# Patient Record
Sex: Female | Born: 1960 | Marital: Single
Health system: Northeastern US, Academic
[De-identification: ages and names within clinical notes are randomized; demographics above are authoritative.]

---

## 2015-11-21 ENCOUNTER — Ambulatory Visit: Admitting: Family

## 2015-11-23 LAB — HX HPV HIGH RISK BY TMA
CASE NUMBER: 2017172002883
HX HPV HIGH RISK BY TMA: NOT DETECTED

## 2015-11-27 LAB — HX GYN FINAL REPORT
CASE NUMBER: 0
HX FINAL CYTOLOGIC INTERPRETATION: NEGATIVE

## 2017-03-21 ENCOUNTER — Ambulatory Visit: Admitting: Orthopaedic Surgery

## 2017-06-30 ENCOUNTER — Ambulatory Visit: Admitting: Orthopaedic Surgery

## 2017-06-30 LAB — HX BASIC METABOLIC PANEL
CASE NUMBER: 2019029002162
HX ANION GAP: 9 — NL (ref 3.0–11.0)
HX BUN: 13 mg/dL — NL (ref 6.0–20.0)
HX CALCIUM LVL: 9.1 mg/dL — NL (ref 8.5–10.5)
HX CHLORIDE: 105 mmol/L — NL (ref 98.0–110.0)
HX CO2: 29 mmol/L — NL (ref 21.0–32.0)
HX CREATININE: 0.855 mg/dL — NL (ref 0.55–1.3)
HX GLUCOSE LVL: 112 mg/dL — ABNORMAL HIGH (ref 70.0–110.0)
HX POTASSIUM LVL: 3.4 mmol/L — ABNORMAL LOW (ref 3.6–5.2)
HX SODIUM LVL: 143 mmol/L — NL (ref 136.0–146.0)

## 2017-06-30 LAB — HX GLOMERULAR FILTRATION RATE (ESTIMATED)
CASE NUMBER: 2019029002162
HX AFN AMER GLOMERULAR FILTRATION RATE: 87 mL/min/{1.73_m2}
HX NON-AFN AMER GLOMERULAR FILTRATION RATE: 76 mL/min/{1.73_m2}

## 2017-06-30 LAB — HX CBC W/ INDICES
CASE NUMBER: 2019029002162
HX ABSOLUTE NRBC COUNT: 0 10*3/uL
HX HCT: 35.7 % — ABNORMAL LOW (ref 36.0–47.0)
HX HGB: 11.5 g/dL — ABNORMAL LOW (ref 11.8–16.0)
HX MCH: 28.7 pg — NL (ref 26.0–34.0)
HX MCHC: 32.2 g/dL — NL (ref 31.0–37.0)
HX MCV: 89 fL — NL (ref 80.0–100.0)
HX MPV: 10.1 fL — NL (ref 9.4–12.4)
HX NRBC PERCENT: 0 % — NL
HX PLATELET: 340 10*3/uL — NL (ref 150.0–400.0)
HX RBC: 4.01 10*6/uL — NL (ref 3.9–5.2)
HX RDW-CV: 13.2 % — NL (ref 11.5–14.5)
HX RDW-SD: 43.5 fL — NL (ref 35.0–51.0)
HX WBC: 8.8 10*3/uL — NL (ref 3.7–11.2)

## 2017-06-30 LAB — HX PSC EKG LAB: CASE NUMBER: 2019029002162

## 2017-07-15 ENCOUNTER — Ambulatory Visit: Admitting: Orthopaedic Surgery

## 2017-07-15 NOTE — Op Note (Signed)
__________________________________________________________________________    OPERATIVE REPORT  DATE:  07/15/2017    PREOPERATIVE DIAGNOSIS:  Right knee medial meniscal tear.     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    PROCEDURES:  Right knee arthroscopy with partial medial meniscectomy,  synovectomy and a chondroplasty three compartments.    SURGEON:  Colman Cater, D.O.    ASSISTANT:  None.     ANESTHESIA:  General.    ESTIMATED BLOOD LOSS:  Minimal.    TOURNIQUET TIME:  23 minutes.    SPECIMENS:  Shavings.    IMPLANTS:  None.    DISPOSITION:  The patient tolerated procedure well and was taken to Recovery  awake and stable.    INDICATIONS:  A 57 year old female with persistent anteromedial right knee pain  associated with swelling and giving way.  She has failed conservative efforts  including injections, therapy and activity modification.  X-rays are  unremarkable.  MRI shows a prior meniscectomy; however, no definitive tear.   Unfortunately, with continued pain and feelings of instability despite all  efforts, she elects for knee arthroscopy to evaluate for any new meniscal tears,  as a cause of her continued mechanical symptoms.  She consents.  She has been  medically cleared.    DESCRIPTION OF PROCEDURE:  The patient was taken awake and stable to the  Operative Suite, placed under general anesthetic and received 2 grams of Ancef  intravenously.  Exam revealed no instability.  Tourniquet was placed on the  right leg and bolsters on the bed as well.  Right leg was then sterilely prepped  and draped in the usual fashion.  The leg was exsanguinated with an Esmarch  bandage.  The tourniquet was inflated to 300 mmHg.    A time out was then performed to confirm the operative right knee.    An inferolateral portal was created.  Arthroscopy began with visualization of  the patella seating centered in the trochlea.  There was  reactive synovitis.   Some chondral debris floating throughout the joint.  Nothing significant.  There  were grade 2 changes to the patella and trochlea.  Medial compartment was  entered.  There was evidence of prior meniscectomy and some new tearing  anteriorly and a questionable tear in the posterior horn as well.    The inferomedial portal was started under direct visualization.  There was  instability through the posterior horn to probing.  It was addressed with the  biters from the posterior aspect of the middle third and then co-planed with a  shaver as well.  This left a stable rim.  Further shaving for partial anterior  horn meniscectomy anteriorly as well.  The meniscus was now probed showing good  stability from anterior to posterior.  Abrasion chondroplasty was performed.   Grade 2 changes diffusely.  All debris was removed.  The synovectomy continues  anterior medial compartments of extension across the notch and extending  anterior to the lateral compartment.  The ACL was stable.  Some mild scar tissue  adjacent to the ACL extending to the lateral compartment which was released with  a shaver.  The meniscus was stable.  There was no identifiable tear.  No fissure  or defect.  Some mild fraying of the articular cartilage of the plateau  addressed with a shaver.  All debris was removed from the medial compartment and  gutter.  Synovectomy of the suprapatellar pouch.  We  finished with chondroplasty  of the patella and trochlea.  The patella does track centrally.  After  irrigation, the knee was infiltrated with 10 mg of Duramorph and 20 mL of 0.5%  Marcaine with epinephrine.  Portals were closed with inverted Monocryl suture  and enhanced with Steri-Strips.  A dry sterile dressing was applied.  She was  extubated and taken to Recovery awake and stable.  She tolerated this procedure  well.    She will be discharged when she meets criteria.  Weightbearing as tolerated with  crutches.  Exercise beginning  today.  She was provided a script of Norco 5/325,  #28 and Ibuprofen 600 mg.  She will call for an appointment in two weeks or  sooner if there is any concern.    Dictated by:  Colman Cater, D.O.    D:  07/15/2017 10:45:25  T:  07/15/2017 11:54:10  E:  07/15/2017 11:54:10  KM/tam  Job# [id number]   SIGNATURE LINE    Electronically signed by Claire Shown on 07/15/2017 at 13:40:52 EST

## 2017-07-21 LAB — HX SURG PATH FINAL REPORT
CASE NUMBER: 0
HX NOTE: 88304

## 2022-02-09 IMAGING — OT DXA BONE DENSITY
2 series · 2 of 2 positions shown · non-contrast
Comparison: none

REASON FOR EXAM: Postmenopausal osteoporosis screening.

[Series 1: — · 1 of 1 slices shown (1 of 2)]
[im 1/1]
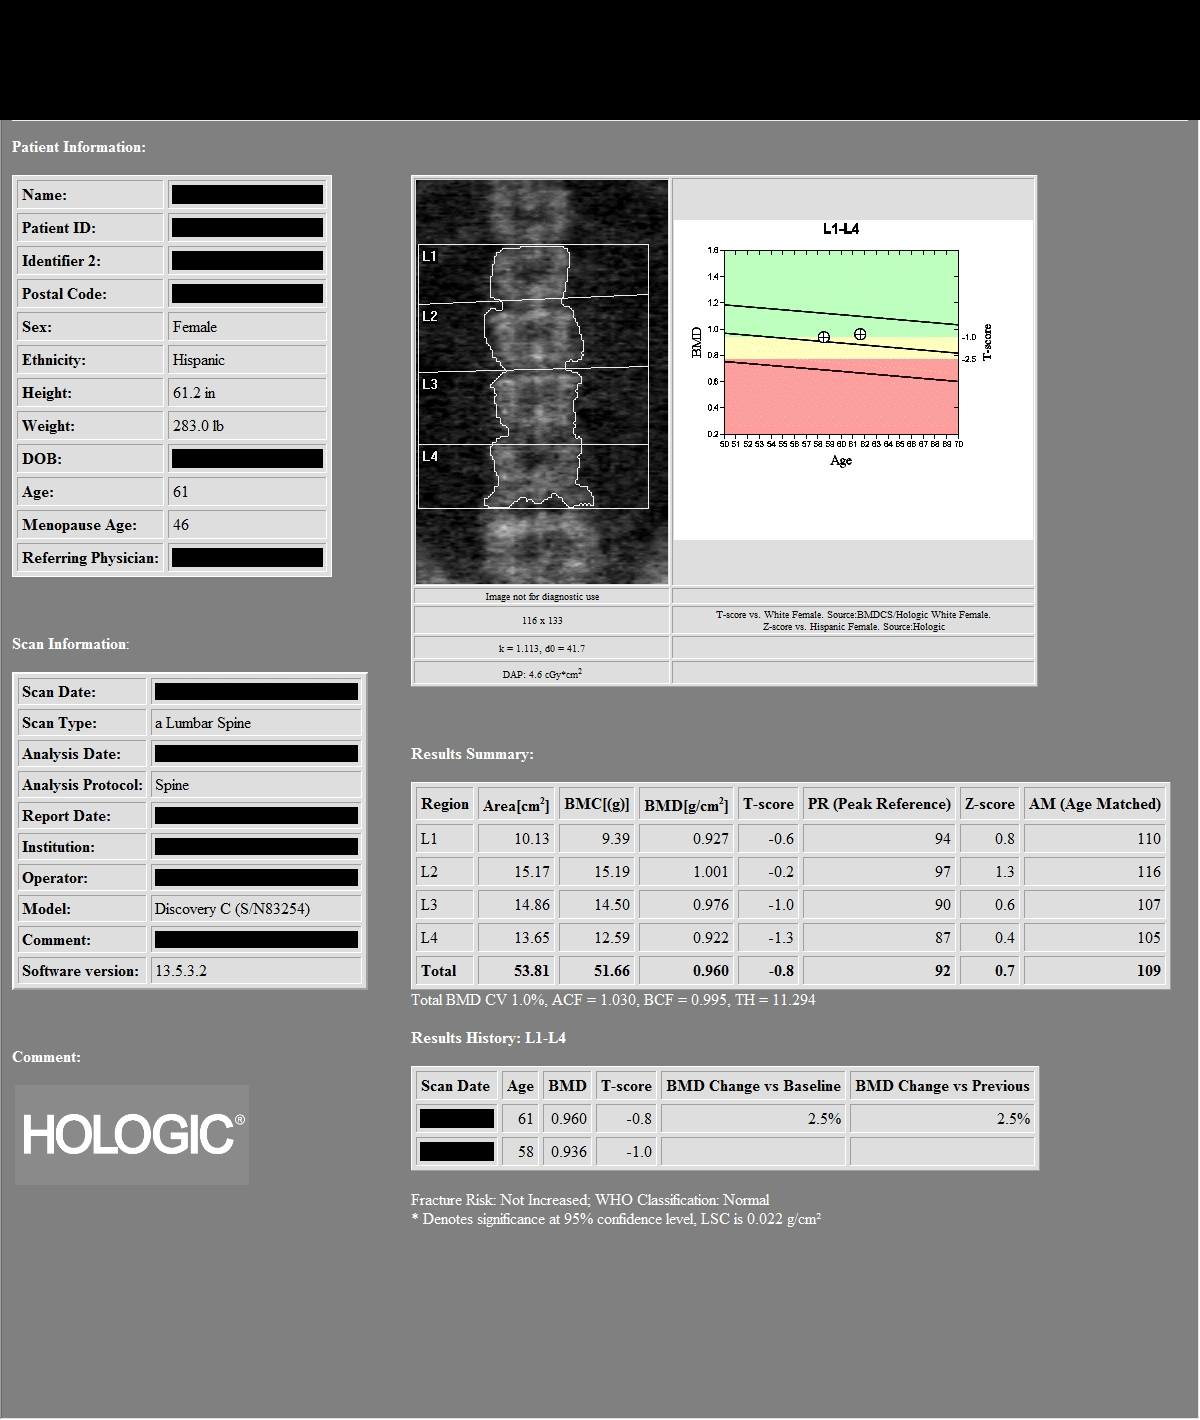

[Series 2: — · left · 1 of 1 slices shown (2 of 2)]
[im 1/1]
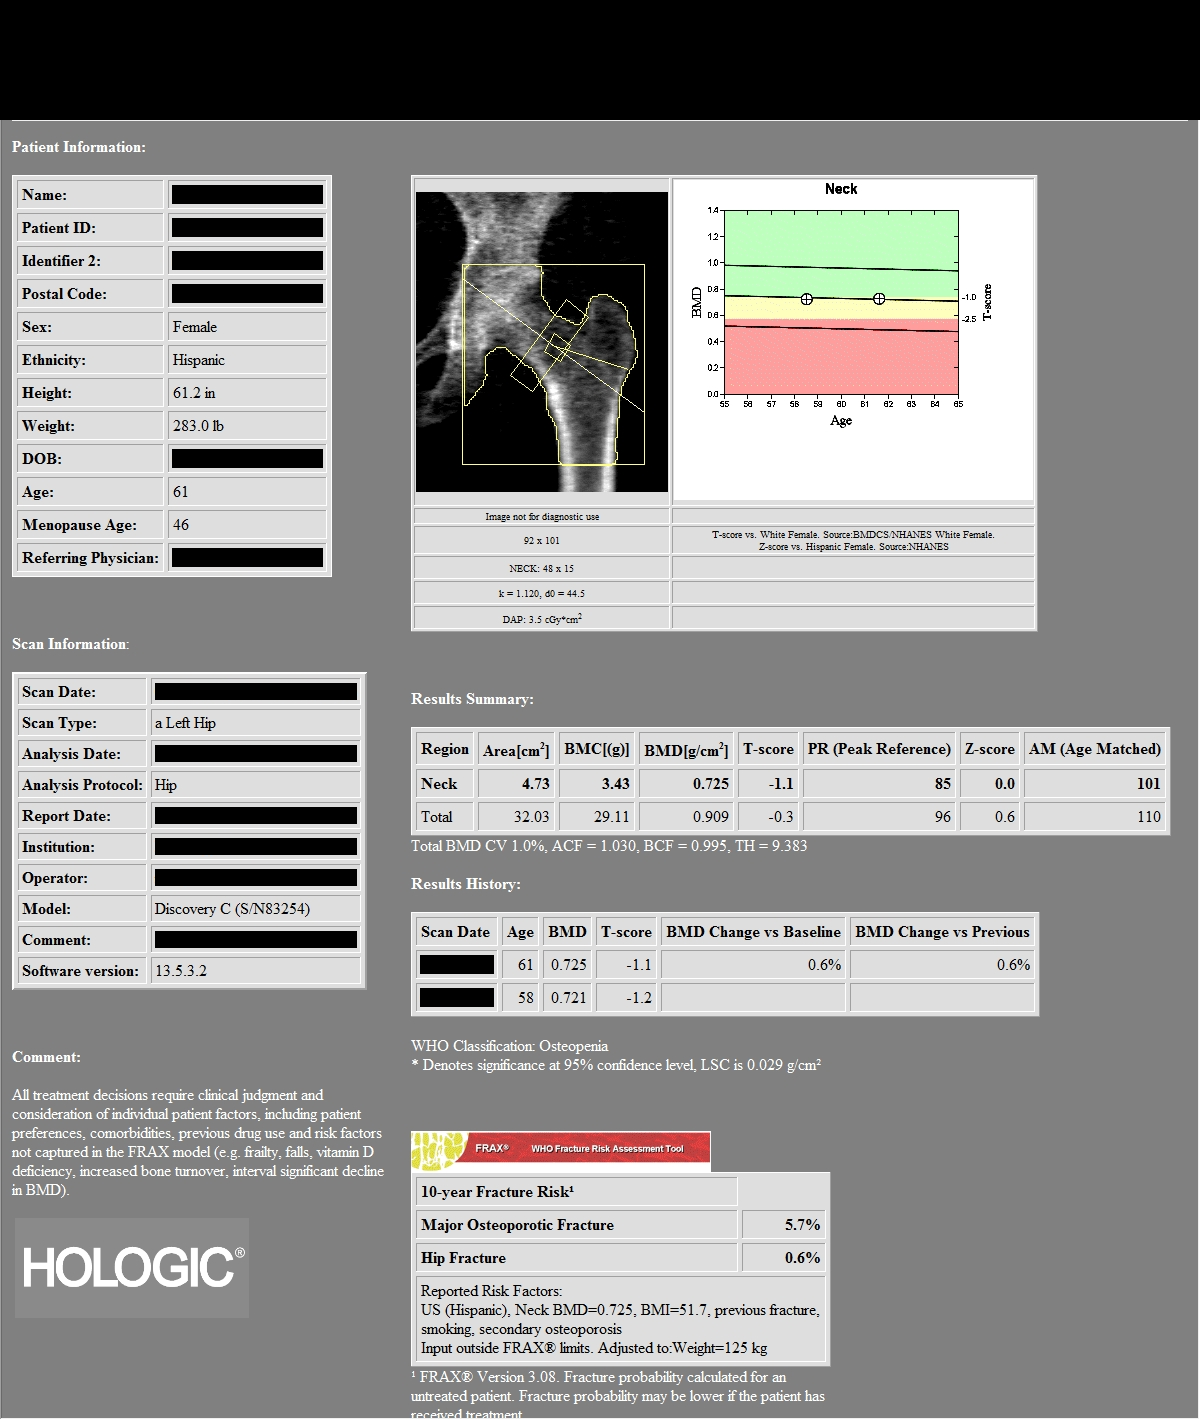

[2 of 2 positions shown; findings below may reference images not displayed]

IMPRESSION: As defined by World Health Organization, the patient meets the criteria for OSTEOPENIA based on hip T-score.

PATIENT DEMOGRAPHICS:  60-year-old Hispanic female.
RISK FACTORS:  Smoker. Personal history of fracture with minimal trauma, diabetes mellitus treated with oral medication and insulin increases risk for secondary osteoporosis.

PRIOR EXAMS:  DXA from [HOSPITAL] dated 01/25/2018.

METHOD:  Scans of the spine and hip were performed using dual energy X-ray densitometry (DXA)
FINDINGS: 1.    Review of scanogram images shows mild spinal arthritic change. No vertebra is excluded from spine analysis.  

2.    The lumbar spine exam using L1-L4 regions shows average Bone Mineral Density is 0.960 gm/cm2 of Hydroxyapatite.  The T-score (comparing patient with a young adult group) is 0.8 standard deviations BELOW mean. The Z-score (comparing patient with an age-matched group) is 0.7 standard deviations ABOVE mean.

COMPARED TO PRIOR DXA, spine bone density was 0.936 gm/cm2.  This is an interval increase of 0.024 gm/cm2 or 2.5 %. Technologist least significant change in the spine is 0.030 gm/cm2. This is not a statistically significant interval increase.

3.  The left hip exam using femoral neck region of interest shows average Bone Mineral Density is 0.725 gm/cm2 of Hydroxyapatite. The T-score (comparing patient with a young adult group) is 1.1 standard deviations BELOW mean. The Z-score (comparing patient with an age-matched group) is 0.0 standard deviations AT mean.

COMPARED TO PRIOR DXA, femoral neck bone density was 0.721 gm/cm2.  This is an interval increase of 0.004 gm/cm2 or 0.6 %. Technologist least significant change in the hip is 0.032 gm/cm2. This is not a statistically significant interval increase.

According to the World Health Organization risk assessment tool (FRAX) for osteopenia only, which uses the femoral neck T score and includes other patient risk factors for fracture, the patient has a 10-year absolute risk of hip fracture of 0.6% and 10-year absolute risk fracture for any major fracture of 5.7%.

RECOMMENDATIONS:  The patient states that she is taking supplements on a regular basis. CESSATION OF SMOKING AND REGULAR EXERCISE TO PATIENT TOLERANCE WOULD BE OF BENEFIT. The patient is currently not taking prescribed medication for prevention of bone loss.  According to criteria established by the National Osteoporosis Foundation, THE PATIENT DOES NOT MEET THE CURRENT INDICATIONS FOR PRESCRIBED MEDICAL THERAPY. The National Osteoporosis Foundation now recommends followup DXA scanning every two years in patients at risk regardless of whether the patient is undergoing pharmacological treatment.
